# Patient Record
Sex: Male | Born: 1945 | Race: Black or African American | Hispanic: No | Marital: Married | Smoking: Former smoker
Health system: Southern US, Community
[De-identification: ages and names within clinical notes are randomized; demographics above are authoritative.]

## PROBLEM LIST (undated history)

## (undated) DIAGNOSIS — N135 Crossing vessel and stricture of ureter without hydronephrosis: Secondary | ICD-10-CM

## (undated) DIAGNOSIS — E7800 Pure hypercholesterolemia, unspecified: Secondary | ICD-10-CM

## (undated) DIAGNOSIS — Z955 Presence of coronary angioplasty implant and graft: Secondary | ICD-10-CM

## (undated) DIAGNOSIS — I2510 Atherosclerotic heart disease of native coronary artery without angina pectoris: Secondary | ICD-10-CM

## (undated) HISTORY — DX: Pure hypercholesterolemia, unspecified: E78.00

## (undated) HISTORY — DX: Crossing vessel and stricture of ureter without hydronephrosis: N13.5

## (undated) HISTORY — DX: Atherosclerotic heart disease of native coronary artery without angina pectoris: I25.10

## (undated) HISTORY — PX: CORONARY ARTERY BYPASS GRAFT: SHX141

## (undated) HISTORY — PX: OTHER SURGICAL HISTORY: SHX169

## (undated) HISTORY — DX: Presence of coronary angioplasty implant and graft: Z95.5

---

## 2015-04-27 ENCOUNTER — Ambulatory Visit: Payer: Self-pay | Admitting: Cardiovascular Disease

## 2015-04-27 ENCOUNTER — Encounter: Payer: Self-pay | Admitting: *Deleted

## 2015-07-14 ENCOUNTER — Ambulatory Visit (INDEPENDENT_AMBULATORY_CARE_PROVIDER_SITE_OTHER): Payer: Medicare Other | Admitting: Cardiology

## 2015-07-14 ENCOUNTER — Encounter: Payer: Self-pay | Admitting: Cardiology

## 2015-07-14 VITALS — BP 122/78 | HR 65 | Ht 71.0 in | Wt 200.0 lb

## 2015-07-14 DIAGNOSIS — I25810 Atherosclerosis of coronary artery bypass graft(s) without angina pectoris: Secondary | ICD-10-CM

## 2015-07-14 DIAGNOSIS — E7800 Pure hypercholesterolemia, unspecified: Secondary | ICD-10-CM | POA: Insufficient documentation

## 2015-07-14 DIAGNOSIS — I2510 Atherosclerotic heart disease of native coronary artery without angina pectoris: Secondary | ICD-10-CM | POA: Insufficient documentation

## 2015-07-14 DIAGNOSIS — E785 Hyperlipidemia, unspecified: Secondary | ICD-10-CM | POA: Insufficient documentation
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
## 2015-07-14 NOTE — Patient Instructions (Signed)
We will schedule you for a stress test  We will request lab work from Dr. Yetta BarreJones.  [facility] will [facility].  I will see you in one year

## 2015-07-23 ENCOUNTER — Telehealth (HOSPITAL_COMMUNITY): Payer: Self-pay

## 2015-07-23 NOTE — Telephone Encounter (Signed)
Encounter complete. 

## 2015-07-27 ENCOUNTER — Encounter (HOSPITAL_COMMUNITY): Payer: Self-pay | Admitting: *Deleted

## 2015-07-27 ENCOUNTER — Ambulatory Visit (HOSPITAL_COMMUNITY)
Admission: RE | Admit: 2015-07-27 | Discharge: 2015-07-27 | Disposition: A | Discharge status: Home / Self Care | Payer: Medicare Other | Source: Ambulatory Visit | Attending: Cardiovascular Disease | Admitting: Cardiovascular Disease

## 2015-07-27 DIAGNOSIS — R0602 Shortness of breath: Secondary | ICD-10-CM | POA: Diagnosis not present

## 2015-07-27 DIAGNOSIS — I25810 Atherosclerosis of coronary artery bypass graft(s) without angina pectoris: Secondary | ICD-10-CM | POA: Insufficient documentation

## 2015-07-27 DIAGNOSIS — R9439 Abnormal result of other cardiovascular function study: Secondary | ICD-10-CM | POA: Diagnosis not present

## 2015-07-27 LAB — EXERCISE TOLERANCE TEST
CHL CUP MPHR: 153 {beats}/min
CSEPEW: 11.7 METS
CSEPPHR: 144 {beats}/min
Exercise duration (min): 10 min
Percent HR: 94 %
RPE: 16
Rest HR: 65 {beats}/min

## 2015-07-27 NOTE — Progress Notes (Unsigned)
Patient ID: Mark Webster, male   DOB: [birth year], 70 y.o.   MRN: [id number] Dr. Herbie BaltimoreHarding reviewed ETT and said it was positive but was ok to d/c patient home.

## 2015-08-02 ENCOUNTER — Other Ambulatory Visit: Payer: Self-pay

## 2015-08-02 DIAGNOSIS — R9439 Abnormal result of other cardiovascular function study: Secondary | ICD-10-CM

## 2015-08-03 ENCOUNTER — Telehealth: Payer: Self-pay | Admitting: Cardiology

## 2015-08-03 ENCOUNTER — Telehealth (HOSPITAL_COMMUNITY): Payer: Self-pay | Admitting: *Deleted

## 2015-08-03 NOTE — Telephone Encounter (Signed)
Left message for patient to call and schedule myoview ordered by Dr. Jordan 

## 2015-08-03 NOTE — Telephone Encounter (Signed)
Called and left voicemail for the patient to call and schedule his lexiscan myoview.

## 2015-08-04 ENCOUNTER — Telehealth: Payer: Self-pay | Admitting: Cardiology

## 2015-08-04 NOTE — Telephone Encounter (Signed)
Called the patient and left a voicemail to call me back to schedule his stress myoview.

## 2015-08-10 ENCOUNTER — Telehealth: Payer: Self-pay | Admitting: Cardiology

## 2015-08-10 NOTE — Telephone Encounter (Signed)
I called the patient and scheduled exercise stress myoview.  Patient stated he already had a stress test(GXT), so not sure if he will show up or not.  Mailed out calendar with appointment and instructions.

## 2015-08-13 ENCOUNTER — Encounter: Payer: Self-pay | Admitting: Cardiology

## 2015-08-19 ENCOUNTER — Telehealth (HOSPITAL_COMMUNITY): Payer: Self-pay

## 2015-08-19 NOTE — Telephone Encounter (Signed)
Encounter complete. 

## 2015-08-24 ENCOUNTER — Ambulatory Visit (HOSPITAL_COMMUNITY)
Admission: RE | Admit: 2015-08-24 | Discharge: 2015-08-24 | Disposition: A | Discharge status: Home / Self Care | Payer: Medicare Other | Source: Ambulatory Visit | Attending: Cardiology | Admitting: Cardiology

## 2015-08-24 DIAGNOSIS — R9439 Abnormal result of other cardiovascular function study: Secondary | ICD-10-CM | POA: Diagnosis not present

## 2015-08-24 DIAGNOSIS — Z87891 Personal history of nicotine dependence: Secondary | ICD-10-CM | POA: Insufficient documentation

## 2015-08-24 LAB — MYOCARDIAL PERFUSION IMAGING
CHL CUP NUCLEAR SDS: 2
CHL CUP NUCLEAR SRS: 0
CHL CUP RESTING HR STRESS: 68 {beats}/min
CHL RATE OF PERCEIVED EXERTION: 17
CSEPED: 7 min
CSEPEDS: 34 s
CSEPHR: 96 %
Estimated workload: 9.4 METS
MPHR: 153 {beats}/min
Peak HR: 146 {beats}/min
SSS: 2
TID: 1.03

## 2015-08-24 MED ORDER — TECHNETIUM TC 99M TETROFOSMIN IV KIT
9.9000 | PACK | Freq: Once | INTRAVENOUS | Status: AC | PRN
  Administered 2015-08-24: 9.9 via INTRAVENOUS
  Filled 2015-08-24: qty 10

## 2015-08-24 MED ORDER — TECHNETIUM TC 99M TETROFOSMIN IV KIT
28.0000 | PACK | Freq: Once | INTRAVENOUS | Status: AC | PRN
  Administered 2015-08-24: 28 via INTRAVENOUS
  Filled 2015-08-24: qty 28

## 2016-02-25 ENCOUNTER — Emergency Department (HOSPITAL_COMMUNITY): Payer: Medicare Other

## 2016-02-25 ENCOUNTER — Emergency Department (HOSPITAL_COMMUNITY)
Admission: EM | Admit: 2016-02-25 | Discharge: 2016-02-25 | Discharge status: Against Medical Advice | Payer: Medicare Other | Attending: Emergency Medicine | Admitting: Emergency Medicine

## 2016-02-25 ENCOUNTER — Encounter (HOSPITAL_COMMUNITY): Payer: Self-pay | Admitting: *Deleted

## 2016-02-25 DIAGNOSIS — Z951 Presence of aortocoronary bypass graft: Secondary | ICD-10-CM | POA: Diagnosis not present

## 2016-02-25 DIAGNOSIS — Z7982 Long term (current) use of aspirin: Secondary | ICD-10-CM | POA: Diagnosis not present

## 2016-02-25 DIAGNOSIS — Z87891 Personal history of nicotine dependence: Secondary | ICD-10-CM | POA: Diagnosis not present

## 2016-02-25 DIAGNOSIS — Z79899 Other long term (current) drug therapy: Secondary | ICD-10-CM | POA: Diagnosis not present

## 2016-02-25 DIAGNOSIS — H47091 Other disorders of optic nerve, not elsewhere classified, right eye: Secondary | ICD-10-CM | POA: Diagnosis not present

## 2016-02-25 DIAGNOSIS — R51 Headache: Secondary | ICD-10-CM | POA: Insufficient documentation

## 2016-02-25 DIAGNOSIS — H578 Other specified disorders of eye and adnexa: Secondary | ICD-10-CM | POA: Diagnosis present

## 2016-02-25 DIAGNOSIS — H532 Diplopia: Secondary | ICD-10-CM | POA: Diagnosis not present

## 2016-02-25 DIAGNOSIS — I2510 Atherosclerotic heart disease of native coronary artery without angina pectoris: Secondary | ICD-10-CM | POA: Diagnosis not present

## 2016-02-25 DIAGNOSIS — R519 Headache, unspecified: Secondary | ICD-10-CM

## 2016-02-25 LAB — BASIC METABOLIC PANEL
ANION GAP: 8 (ref 5–15)
BUN: 14 mg/dL (ref 6–20)
CALCIUM: 9.7 mg/dL (ref 8.9–10.3)
CO2: 29 mmol/L (ref 22–32)
CREATININE: 0.74 mg/dL (ref 0.61–1.24)
Chloride: 101 mmol/L (ref 101–111)
GLUCOSE: 102 mg/dL — AB (ref 65–99)
Potassium: 3.8 mmol/L (ref 3.5–5.1)
Sodium: 138 mmol/L (ref 135–145)

## 2016-02-25 LAB — CBC
HCT: 44.5 % (ref 39.0–52.0)
Hemoglobin: 15 g/dL (ref 13.0–17.0)
MCH: 29.4 pg (ref 26.0–34.0)
MCHC: 33.7 g/dL (ref 30.0–36.0)
MCV: 87.1 fL (ref 78.0–100.0)
PLATELETS: 201 10*3/uL (ref 150–400)
RBC: 5.11 MIL/uL (ref 4.22–5.81)
RDW: 13.5 % (ref 11.5–15.5)
WBC: 10.1 10*3/uL (ref 4.0–10.5)
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
## 2016-02-25 NOTE — ED Notes (Signed)
Pt & spouse requesting d/c papers & CT CD.  Called CT & requested.  Dahlia ClientHannah, PA-C informed of pt's request for d/c papers.

## 2016-02-25 NOTE — Discharge Instructions (Signed)
1. Medications: usual home medications 2. Treatment: rest, drink plenty of fluids,  3. Follow Up: Please followup with your primary doctor in 1-2 days for discussion of your diagnoses and further evaluation after today's visit; if you do not have a primary care doctor use the resource guide provided to find one; Please return to the ER for worsening symptoms, worsening vision, constant headaches or other concerns

## 2016-02-25 NOTE — ED Triage Notes (Signed)
Pt states he fell and hit his head ~2 weeks ago.. Pt states his right eye started pointing upward independent of his left eye 10 days ago and developed photophobia 6 days ago. Pt states he has mild headache occasionally. Pt's PCP sent patient to have head CT.

## 2016-10-18 IMAGING — NM NM MISC PROCEDURE
4 series · 24 of 24 positions shown · non-contrast
Comparison: none

[Series 1: wbr_r-proj_st wbr rest · 6.40mm/px · 6 of 64 frames shown]
[frame 6/64]
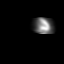
[frame 16/64]
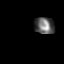
[frame 27/64]
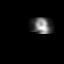
[frame 38/64]
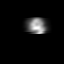
[frame 48/64]
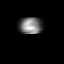
[frame 59/64]
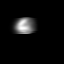

[Series 1: wbr rest · 6.40mm/px · 6 of 64 frames shown]
[frame 6/64]
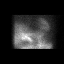
[frame 16/64]
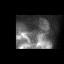
[frame 27/64]
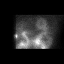
[frame 38/64]
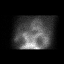
[frame 48/64]
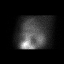
[frame 59/64]
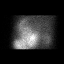

[Series 2: wbr stress ng · 6.40mm/px · 6 of 64 frames shown]
[frame 6/64]
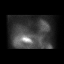
[frame 16/64]
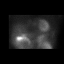
[frame 27/64]
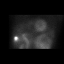
[frame 38/64]
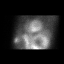
[frame 48/64]
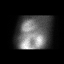
[frame 59/64]
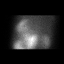

[Series 2: wbr_s-proj_st wbr stress ng · 6.40mm/px · 6 of 64 frames shown]
[frame 6/64]
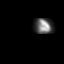
[frame 16/64]
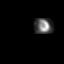
[frame 27/64]
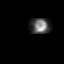
[frame 38/64]
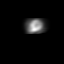
[frame 48/64]
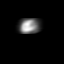
[frame 59/64]
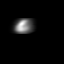

[24 of 24 positions shown; findings below may reference images not displayed]

Canned report from images found in remote index.

Refer to host system for actual result text.

## 2017-08-27 ENCOUNTER — Telehealth: Payer: Self-pay | Admitting: *Deleted

## 2017-08-27 NOTE — Telephone Encounter (Addendum)
Left a message to call back.   The patient came in today as a walk in requesting cardiac clearance for a procedure. More information is needed.

## 2017-08-31 NOTE — Telephone Encounter (Signed)
Spoke with the patient. He stated that he already had the surgery and nothing further was needed at this point.
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
## 2019-02-14 ENCOUNTER — Telehealth (INDEPENDENT_AMBULATORY_CARE_PROVIDER_SITE_OTHER): Payer: Medicare Other | Admitting: Student

## 2019-02-14 ENCOUNTER — Encounter: Payer: Self-pay | Admitting: Physician Assistant

## 2019-02-14 VITALS — Ht 71.0 in | Wt 172.0 lb

## 2019-02-14 DIAGNOSIS — I2510 Atherosclerotic heart disease of native coronary artery without angina pectoris: Secondary | ICD-10-CM

## 2019-02-14 DIAGNOSIS — E785 Hyperlipidemia, unspecified: Secondary | ICD-10-CM

## 2019-02-14 DIAGNOSIS — Z951 Presence of aortocoronary bypass graft: Secondary | ICD-10-CM

## 2019-02-14 NOTE — Patient Instructions (Signed)
Medication Instructions:  Your physician recommends that you continue on your current medications as directed. Please refer to the Current Medication list given to you today.  *If you need a refill on your cardiac medications before your next appointment, please call your pharmacy*  Lab Work: None ordered   If you have labs (blood work) drawn today and your tests are completely normal, you will receive your results only by: Marland Kitchen MyChart Message (if you have MyChart) OR . A paper copy in the mail If you have any lab test that is abnormal or we need to change your treatment, we will call you to review the results.  Testing/Procedures: None ordered   Follow-Up: At [facility], you and your health needs are our priority.  As part of our continuing mission to provide you with exceptional heart care, we have created designated Provider Care Teams.  These Care Teams include your primary Cardiologist (physician) and Advanced Practice Providers (APPs -  Physician Assistants and Nurse Practitioners) who all work together to provide you with the care you need, when you need it.  Your next appointment:   6 month(s)  The format for your next appointment:   In Person  Provider:   You may see Peter Martinique, MD or one of the following Advanced Practice Providers on your designated Care Team:    Almyra Deforest, PA-C  Fabian Sharp, [place] or   Roby Lofts, [place]   Other Instructions You can follow up with [place] in 6 months or see a cardiologist in [place]

## 2019-08-06 ENCOUNTER — Encounter: Payer: Self-pay | Admitting: Cardiology

## 2019-08-07 ENCOUNTER — Telehealth (INDEPENDENT_AMBULATORY_CARE_PROVIDER_SITE_OTHER): Payer: Medicare Other | Admitting: Cardiology

## 2019-08-07 ENCOUNTER — Encounter: Payer: Self-pay | Admitting: Cardiology

## 2019-08-07 VITALS — BP 138/82 | HR 68 | Ht 71.0 in | Wt 172.0 lb

## 2019-08-07 DIAGNOSIS — I25810 Atherosclerosis of coronary artery bypass graft(s) without angina pectoris: Secondary | ICD-10-CM

## 2019-08-07 DIAGNOSIS — E785 Hyperlipidemia, unspecified: Secondary | ICD-10-CM
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
## 2019-08-07 NOTE — Patient Instructions (Signed)
Medication Instructions:  Continue current medications  *If you need a refill on your cardiac medications before your next appointment, please call your pharmacy*   Lab Work: None Ordered   Testing/Procedures: None Ordered   Follow-Up: At [facility], you and your health needs are our priority.  As part of our continuing mission to provide you with exceptional heart care, we have created designated Provider Care Teams.  These Care Teams include your primary Cardiologist (physician) and Advanced Practice Providers (APPs -  Physician Assistants and Nurse Practitioners) who all work together to provide you with the care you need, when you need it.  We recommend signing up for the patient portal called "MyChart".  Sign up information is provided on this After Visit Summary.  MyChart is used to connect with patients for Virtual Visits (Telemedicine).  Patients are able to view lab/test results, encounter notes, upcoming appointments, etc.  Non-urgent messages can be sent to your provider as well.   To learn more about what you can do with MyChart, go to [email or web address].com.    Your next appointment:   6 month(s)  The format for your next appointment:   In Person  Provider:   You may see Peter Jordan, MD or one of the following Advanced Practice Providers on your designated Care Team:    Hao Meng, PA-C  Angela Duke, PA-C or   Krista Kroeger, PA-C      

## 2020-03-30 ENCOUNTER — Ambulatory Visit: Payer: Medicare Other | Admitting: Cardiology
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
## 2020-05-27 ENCOUNTER — Encounter: Payer: Self-pay | Admitting: Cardiology

## 2020-05-27 ENCOUNTER — Ambulatory Visit (INDEPENDENT_AMBULATORY_CARE_PROVIDER_SITE_OTHER): Payer: Medicare Other | Admitting: Cardiology

## 2020-05-27 ENCOUNTER — Other Ambulatory Visit: Payer: Self-pay

## 2020-05-27 VITALS — BP 148/92 | HR 77 | Ht 71.0 in | Wt 176.2 lb

## 2020-05-27 DIAGNOSIS — E785 Hyperlipidemia, unspecified: Secondary | ICD-10-CM | POA: Diagnosis not present

## 2020-05-27 DIAGNOSIS — I25810 Atherosclerosis of coronary artery bypass graft(s) without angina pectoris: Secondary | ICD-10-CM

## 2020-05-27 DIAGNOSIS — Z951 Presence of aortocoronary bypass graft: Secondary | ICD-10-CM
